# Patient Record
Sex: Female | Born: 1970 | Race: White | Hispanic: No | Marital: Single | Smoking: Former smoker
Health system: Southern US, Community
[De-identification: ages and names within clinical notes are randomized; demographics above are authoritative.]

## PROBLEM LIST (undated history)

## (undated) DIAGNOSIS — G2581 Restless legs syndrome: Secondary | ICD-10-CM

## (undated) DIAGNOSIS — I471 Supraventricular tachycardia, unspecified: Secondary | ICD-10-CM

## (undated) HISTORY — PX: BREAST SURGERY: SHX581

## (undated) HISTORY — PX: TUBAL LIGATION: SHX77

---

## 2008-12-25 ENCOUNTER — Emergency Department (HOSPITAL_BASED_OUTPATIENT_CLINIC_OR_DEPARTMENT_OTHER): Admission: EM | Admit: 2008-12-25 | Discharge: 2008-12-25 | Payer: Self-pay | Admitting: Emergency Medicine

## 2009-10-10 ENCOUNTER — Emergency Department (HOSPITAL_BASED_OUTPATIENT_CLINIC_OR_DEPARTMENT_OTHER): Admission: EM | Admit: 2009-10-10 | Discharge: 2009-10-11 | Payer: Self-pay | Admitting: Emergency Medicine

## 2009-12-27 ENCOUNTER — Emergency Department (HOSPITAL_BASED_OUTPATIENT_CLINIC_OR_DEPARTMENT_OTHER): Admission: EM | Admit: 2009-12-27 | Discharge: 2009-12-27 | Payer: Self-pay | Admitting: Emergency Medicine

## 2010-06-04 ENCOUNTER — Emergency Department (HOSPITAL_BASED_OUTPATIENT_CLINIC_OR_DEPARTMENT_OTHER): Admission: EM | Admit: 2010-06-04 | Discharge: 2010-06-04 | Payer: Self-pay | Admitting: Emergency Medicine

## 2010-06-04 ENCOUNTER — Ambulatory Visit: Payer: Self-pay | Admitting: Diagnostic Radiology

## 2010-09-26 LAB — CBC
HCT: 32.6 % — ABNORMAL LOW (ref 36.0–46.0)
Hemoglobin: 11.3 g/dL — ABNORMAL LOW (ref 12.0–15.0)
MCHC: 34.6 g/dL (ref 30.0–36.0)
MCV: 84.3 fL (ref 78.0–100.0)
Platelets: 301 10*3/uL (ref 150–400)
RDW: 12.2 % (ref 11.5–15.5)

## 2010-09-26 LAB — DIFFERENTIAL
Basophils Absolute: 0.1 10*3/uL (ref 0.0–0.1)
Basophils Relative: 1 % (ref 0–1)
Eosinophils Absolute: 0.5 10*3/uL (ref 0.0–0.7)
Eosinophils Relative: 7 % — ABNORMAL HIGH (ref 0–5)
Lymphocytes Relative: 35 % (ref 12–46)
Monocytes Absolute: 0.6 10*3/uL (ref 0.1–1.0)

## 2011-02-26 ENCOUNTER — Emergency Department (HOSPITAL_BASED_OUTPATIENT_CLINIC_OR_DEPARTMENT_OTHER)
Admission: EM | Admit: 2011-02-26 | Discharge: 2011-02-26 | Disposition: A | Discharge status: Home / Self Care | Payer: Self-pay | Attending: Emergency Medicine | Admitting: Emergency Medicine

## 2011-02-26 ENCOUNTER — Encounter: Payer: Self-pay | Admitting: Student

## 2011-02-26 ENCOUNTER — Emergency Department (INDEPENDENT_AMBULATORY_CARE_PROVIDER_SITE_OTHER): Payer: Self-pay

## 2011-02-26 DIAGNOSIS — M79609 Pain in unspecified limb: Secondary | ICD-10-CM | POA: Insufficient documentation

## 2011-02-26 DIAGNOSIS — M7989 Other specified soft tissue disorders: Secondary | ICD-10-CM

## 2011-02-26 DIAGNOSIS — J45909 Unspecified asthma, uncomplicated: Secondary | ICD-10-CM | POA: Insufficient documentation

## 2011-02-26 DIAGNOSIS — M79606 Pain in leg, unspecified: Secondary | ICD-10-CM

## 2011-02-26 HISTORY — DX: Restless legs syndrome: G25.81

## 2011-02-26 MED ORDER — OXYCODONE-ACETAMINOPHEN 5-325 MG PO TABS: 1.0000 | ORAL_TABLET | ORAL | Status: AC | PRN

## 2011-02-26 MED ORDER — HYDROCODONE-ACETAMINOPHEN 5-325 MG PO TABS
1.0000 | ORAL_TABLET | Freq: Once | ORAL | Status: AC
  Administered 2011-02-26: 1 via ORAL
  Filled 2011-02-26: qty 1

## 2011-02-26 NOTE — ED Provider Notes (Signed)
History     CSN: [id number] Arrival date & time: 02/26/2011  4:18 PM  Chief Complaint  Patient presents with  . Leg Pain   HPI Comments: Patient says she had onset of pain in the left calf last night she rated the pain at an 8 she noted that her left calf with swelling. The pain continued today and she took a [employer], without relief. After work she came to [facility] ED for evaluation.  There is no history of injury.  She has not been on any long trips. She is not on birth control pills.   Patient is a 40 y.o. female presenting with leg pain. The history is provided by the patient. No language interpreter was used.  Leg Pain  The incident occurred 12 to 24 hours ago. The incident occurred at home. There was no injury mechanism. Pain location: Her pain is localized to the left calf. The quality of the pain is described as aching. The pain is at a severity of 8/10. The pain is moderate. The pain has been constant since onset. The symptoms are aggravated by activity. Treatments tried: She took a BC powder without relief.    Past Medical History  Diagnosis Date  . Asthma   . Restless leg syndrome     Past Surgical History  Procedure Date  . Tubal ligation   . Cesarean section     No family history on file.  History  Substance Use Topics  . Smoking status: Never Smoker   . Smokeless tobacco: Never Used  . Alcohol Use: Yes    OB History    Grav Para Term Preterm Abortions TAB SAB Ect Mult Living                  Review of Systems  Constitutional: Negative for fever.  HENT: Negative.   Eyes: Negative.   Respiratory: Negative.   Cardiovascular: Negative.   Genitourinary: Negative.   Musculoskeletal:       Left calf tenderness and pain.  Neurological: Negative.   Psychiatric/Behavioral: Negative.     Physical Exam  BP 120/66  Pulse 80  Temp(Src) 98.2 F (36.8 C) (Oral)  Resp 20  Wt 160 lb (72.576 kg)  SpO2 100%  LMP 02/06/2011  Physical Exam    Nursing note and vitals reviewed. Constitutional: She appears well-developed and well-nourished. Distressed: and mild to moderate distress with left calf pain.  HENT:  Head: Normocephalic and atraumatic.  Right Ear: External ear normal.  Left Ear: External ear normal.  Mouth/Throat: No oropharyngeal exudate.  Eyes: EOM are normal. Pupils are equal, round, and reactive to light. No scleral icterus.  Neck: Normal range of motion. Neck supple.  Cardiovascular: Normal rate, regular rhythm and normal heart sounds.   Pulmonary/Chest: Effort normal and breath sounds normal.  Abdominal: Soft. Bowel sounds are normal. She exhibits no distension. There is no tenderness.  Musculoskeletal:       She has left calf tenderness and some mild swelling. There is no Homans' sign. There is no rash, or other skin lesion. She has intact pulses sensation and function in the left foot.  Lymphadenopathy:    She has no cervical adenopathy.  Neurological: She is alert.       No sensory or motor deficits.  Skin: Skin is warm and dry. No rash noted. No erythema.  Psychiatric: She has a normal mood and affect. Her behavior is normal.    ED Course  Procedures  MDM  Course in the ED, the patient was seen and had physical exam. Hydrocodone-acetaminophen was ordered for her pain. Venous ultrasound of the left calf was ordered.  US Venous Img Lower Unilateral Left Status:  Final result                     Study Result     *RADIOLOGY REPORT*  Clinical Data: Left leg pain and swelling.  LEFT LOWER EXTREMITY VENOUS DUPLEX ULTRASOUND  Technique: Gray-scale sonography with graded compression, as well  as color Doppler and duplex ultrasound were performed to evaluate  the deep venous system of the lower extremity from the level of the  common femoral vein through the popliteal and proximal calf veins.  Spectral Doppler was utilized to evaluate flow at rest and with  distal augmentation maneuvers.   Comparison: None.  Findings: Normal compressibility of the common femoral,  superficial femoral, and popliteal veins is demonstrated, as well  as the visualized proximal calf veins. No filling defects to  suggest DVT on grayscale or color Doppler imaging. Doppler  waveforms show normal direction of venous flow, normal respiratory  phasicity and response to augmentation.  IMPRESSION:  No evidence of left lower extremity deep vein thrombosis.  Original Report Authenticated By: Richarda Overlie, M.D.   8:22 PM Venous ultrasound was negative.  Reassured and released.  Recheck in 5-7 days if pain and swelling persists.  Rx for Percocet q4h prn pain.    Carleene Cooper III, MD 02/26/11 [phone]

## 2011-02-26 NOTE — ED Notes (Signed)
Pt in with c/o left calf pain last night with sudden onset, reports swelling to left calf. HX of restless leg. No relief with elevation or rest

## 2012-03-04 IMAGING — CR DG CHEST 2V
2 series · 2 of 2 positions shown · non-contrast
Comparison: None.

CLINICAL DATA: Shortness of breath and asthma.  Chest tightness.

CHEST - 2 VIEW

[w chest pa]
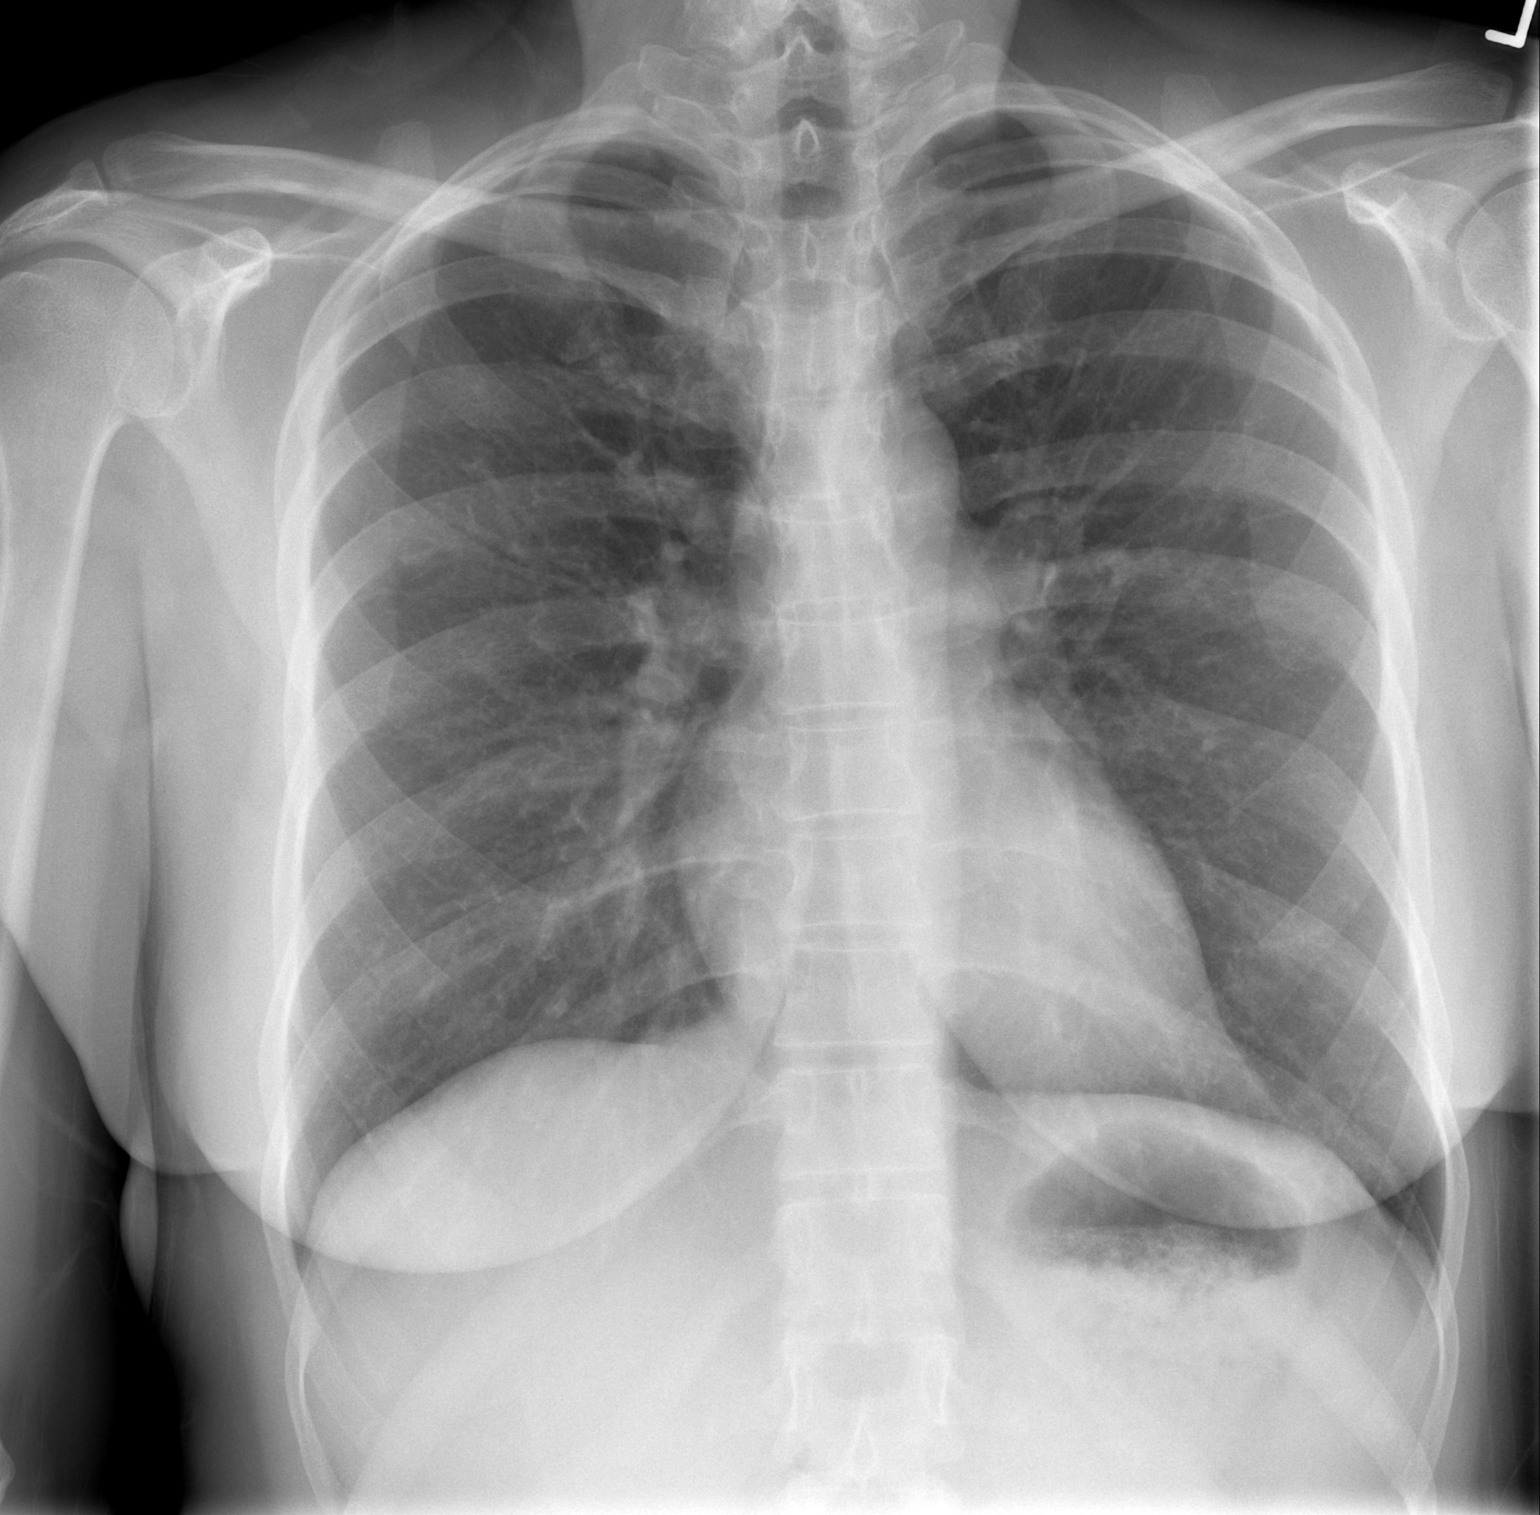

[w chest lat]
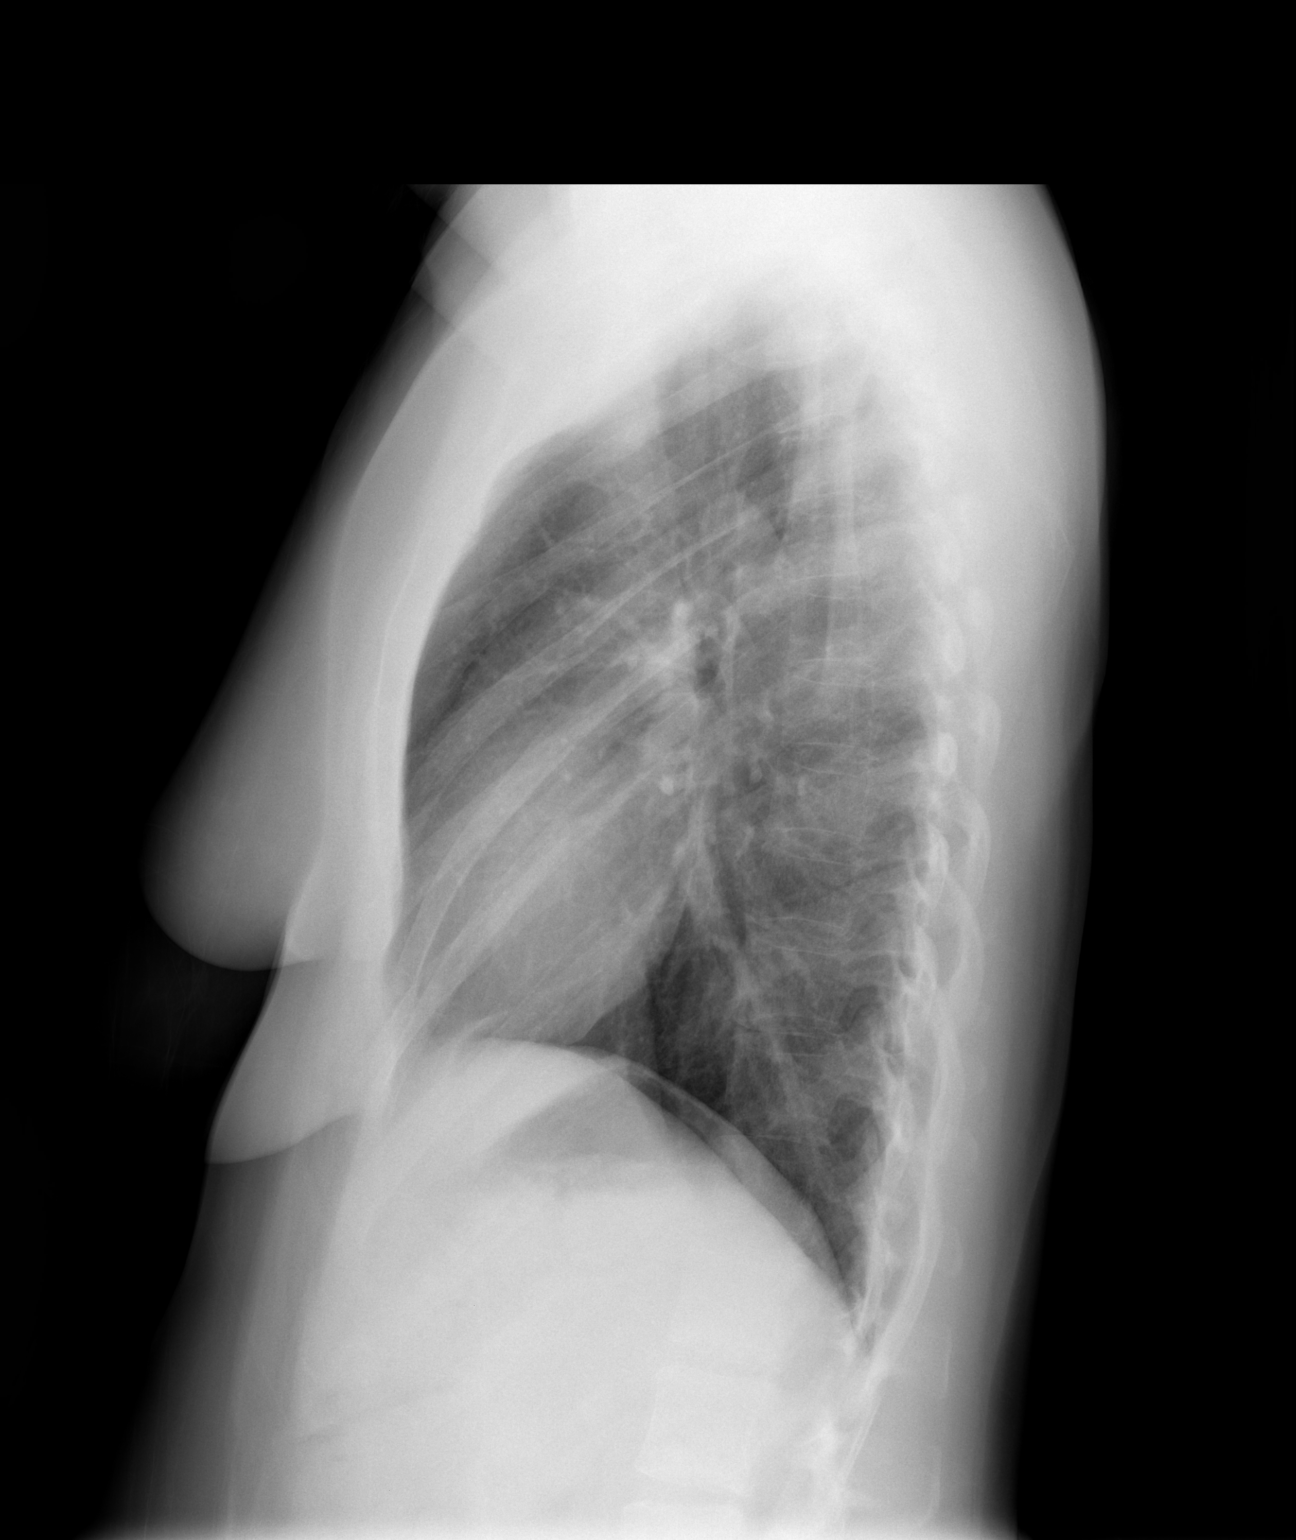

[2 of 2 positions shown; findings below may reference images not displayed]

FINDINGS: The lungs are mildly hyperexpanded with left base
atelectasis seen.  No confluent airspace opacities, pleural
effusion or pneumothorax is present.  The heart is normal in size
and contour.  The upper abdomen and osseous structures are normal.
IMPRESSION: Mild hyperexpansion and left base atelectasis.  No acute findings.

## 2016-01-17 ENCOUNTER — Emergency Department (INDEPENDENT_AMBULATORY_CARE_PROVIDER_SITE_OTHER): Payer: Self-pay

## 2016-01-17 ENCOUNTER — Emergency Department
Admission: EM | Admit: 2016-01-17 | Discharge: 2016-01-17 | Disposition: A | Discharge status: Home / Self Care | Payer: Self-pay | Source: Home / Self Care

## 2016-01-17 ENCOUNTER — Encounter: Payer: Self-pay | Admitting: *Deleted

## 2016-01-17 DIAGNOSIS — J209 Acute bronchitis, unspecified: Secondary | ICD-10-CM

## 2016-01-17 DIAGNOSIS — R0989 Other specified symptoms and signs involving the circulatory and respiratory systems: Secondary | ICD-10-CM

## 2016-01-17 DIAGNOSIS — R05 Cough: Secondary | ICD-10-CM

## 2016-01-17 HISTORY — DX: Supraventricular tachycardia, unspecified: I47.10

## 2016-01-17 HISTORY — DX: Supraventricular tachycardia: I47.1

## 2016-01-17 MED ORDER — AZITHROMYCIN 250 MG PO TABS: ORAL_TABLET | ORAL | Status: AC

## 2016-01-17 MED ORDER — PREDNISONE 20 MG PO TABS: ORAL_TABLET | ORAL | Status: AC

## 2016-01-17 NOTE — Discharge Instructions (Signed)
Continue plain guaifenesin (1200mg  extended release tabs such as Mucinex) twice daily, with plenty of water, for cough and congestion.  May add Pseudoephedrine (30mg , one or two every 4 to 6 hours) for sinus congestion.  Get adequate rest.   May use Afrin nasal spray (or generic oxymetazoline) twice daily for about 5 days and then discontinue.  Also recommend using saline nasal spray several times daily and saline nasal irrigation (AYR is a common brand).  Use Flonase nasal spray each morning after using Afrin nasal spray and saline nasal irrigation. Try warm salt water gargles for sore throat.  Stop all antihistamines for now, and other non-prescription cough/cold preparations. May take Delsym Cough Suppressant at bedtime for nighttime cough.    Follow-up with family doctor if not improving about one week.

## 2016-01-17 NOTE — ED Notes (Signed)
Pt c/o mostly nonproductive cough and runny nose x 3 mths. She reports episodes of SOB at times with coughing. She also c/o swollen lymph nodes in neck x 2 days.

## 2016-01-17 NOTE — ED Provider Notes (Signed)
CSN: [id number]     Arrival date & time 01/17/16  0911 History   None    Chief Complaint  Patient presents with  . Cough      HPI Comments: Patient reports that she developed a cold-like illness about 3 months ago, and has had persistent sinus congestion and non-productive cough.  During the past two days she has had soreness in her cervical lymph nodes.  She has occasional shortness of breath with activity.  She occasionally coughs until she gags.  No fevers, chills, and sweats.  No pleuritic pain.  She has a history of asthma, and a family history of asthma.  The history is provided by the patient.    Past Medical History  Diagnosis Date  . Asthma   . Restless leg syndrome   . SVT (supraventricular tachycardia) [facility](HCC)    Past Surgical History  Procedure Laterality Date  . Tubal ligation    . Cesarean section    . Breast surgery     History reviewed. No pertinent family history. Social History  Substance Use Topics  . Smoking status: Former [employer] developermoker  . Smokeless tobacco: Never Used     Comment: quit at age 45yo  . Alcohol Use: Yes     Comment: 4-5 q wk   OB History    No data available     Review of Systems No sore throat + cough No pleuritic pain No wheezing + nasal congestion No post-nasal drainage No sinus pain/pressure No itchy/red eyes No earache No hemoptysis + SOB with cough No fever/chills No nausea No vomiting No abdominal pain No diarrhea No urinary symptoms No skin rash + fatigue No myalgias + headache Used OTC meds without relief  Allergies  Penicillins  Home Medications   Prior to Admission medications   Medication Sig Start Date End Date Taking? Authorizing Provider  dextromethorphan-guaiFENesin (MUCINEX DM) 30-600 MG 12hr tablet Take 1 tablet by mouth 2 (two) times daily.   Yes Historical Provider, MD  GuaiFENesin (MUCINEX PO) Take by mouth.   Yes Historical Provider, MD  azithromycin (ZITHROMAX Z-PAK) 250 MG tablet Take 2 tabs today;  then begin one tab once daily for 4 more days. 01/17/16   Lattie HawStephen A Beese, MD  predniSONE (DELTASONE) 20 MG tablet Take one tab by mouth twice daily for 5 days, then one daily for 3 days. Take with food. 01/17/16   Lattie HawStephen A Beese, MD   Meds Ordered and Administered this Visit  Medications - No data to display  BP 121/77 mmHg  Pulse 71  Temp(Src) 97.9 F (36.6 C) (Oral)  Resp 16  Ht 5\' 2"  (1.575 m)  Wt 165 lb (74.844 kg)  BMI 30.17 kg/m2  SpO2 100%  LMP 01/09/2016 No data found.   Physical Exam Nursing notes and Vital Signs reviewed. Appearance:  Patient appears stated age, and in no acute distress Eyes:  Pupils are equal, round, and reactive to light and accomodation.  Extraocular movement is intact.  Conjunctivae are not inflamed  Ears:  Canals normal.  Tympanic membranes normal.  Nose:  Mildly congested turbinates.  No sinus tenderness.   Pharynx:  Normal Neck:  Supple.  Tender enlarged posterior/lateral nodes are palpated bilaterally  Lungs:  Clear to auscultation.  Breath sounds are equal.  Moving air well. Heart:  Regular rate and rhythm without murmurs, rubs, or gallops.  Abdomen:  Nontender without masses or hepatosplenomegaly.  Bowel sounds are present.  No CVA or flank tenderness.  Extremities:  No  edema.  Skin:  No rash present.   ED Course  Procedures none    Imaging Review Dg Chest 2 View  01/17/2016  CLINICAL DATA:  Cough and congestion EXAM: CHEST  2 VIEW COMPARISON:  06/04/2010 FINDINGS: The heart size and mediastinal contours are within normal limits. Both lungs are clear. The visualized skeletal structures are unremarkable. IMPRESSION: No active cardiopulmonary disease. Electronically Signed   By: Marlan Palau M.D.   On: 01/17/2016 10:54      MDM   1. Acute bronchitis, unspecified organism; ?post-infectious cough vs pertussis   Begin Z-pak for atypical coverage.  Begin prednisone burst/taper Continue plain guaifenesin (  extended release tabs such  as Mucinex) twice daily, with plenty of water, for cough and congestion.  May add Pseudoephedrine ( , one or two every 4 to 6 hours) for sinus congestion.  Get adequate rest.   May use Afrin nasal spray (or generic oxymetazoline) twice daily for about 5 days and then discontinue.  Also recommend using saline nasal spray several times daily and saline nasal irrigation (AYR is a common brand).  Use Flonase nasal spray each morning after using Afrin nasal spray and saline nasal irrigation. Try warm salt water gargles for sore throat.  Stop all antihistamines for now, and other non-prescription cough/cold preparations. May take Delsym Cough Suppressant at bedtime for nighttime cough.    Follow-up with family doctor if not improving about one week.     Lattie Haw, MD 01/20/16 [phone]

## 2017-10-17 IMAGING — DX DG CHEST 2V
2 series · 2 of 2 positions shown · non-contrast
Comparison: 06/04/2010

CLINICAL DATA: Cough and congestion

EXAM:
CHEST  2 VIEW

[chest pa]
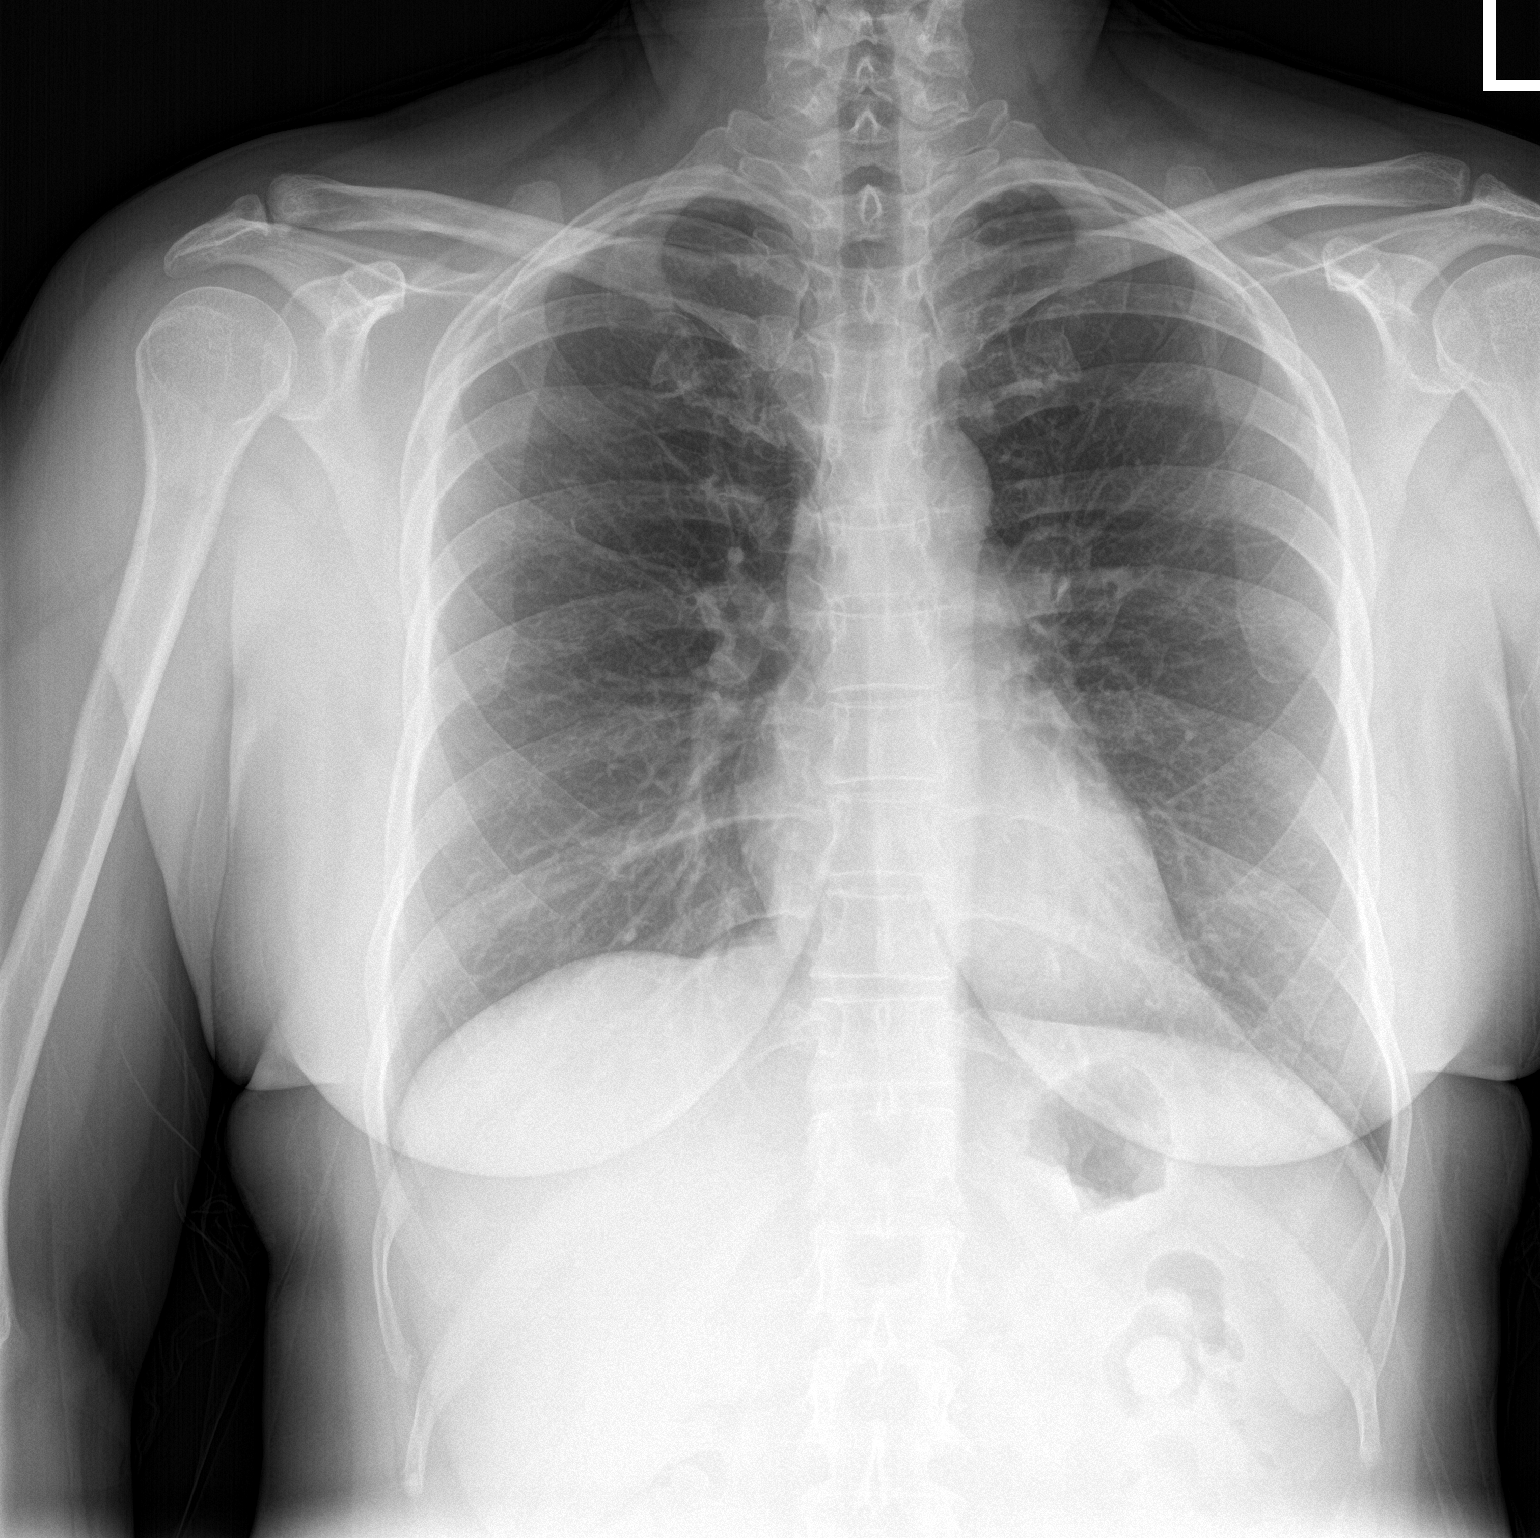

[chest lat]
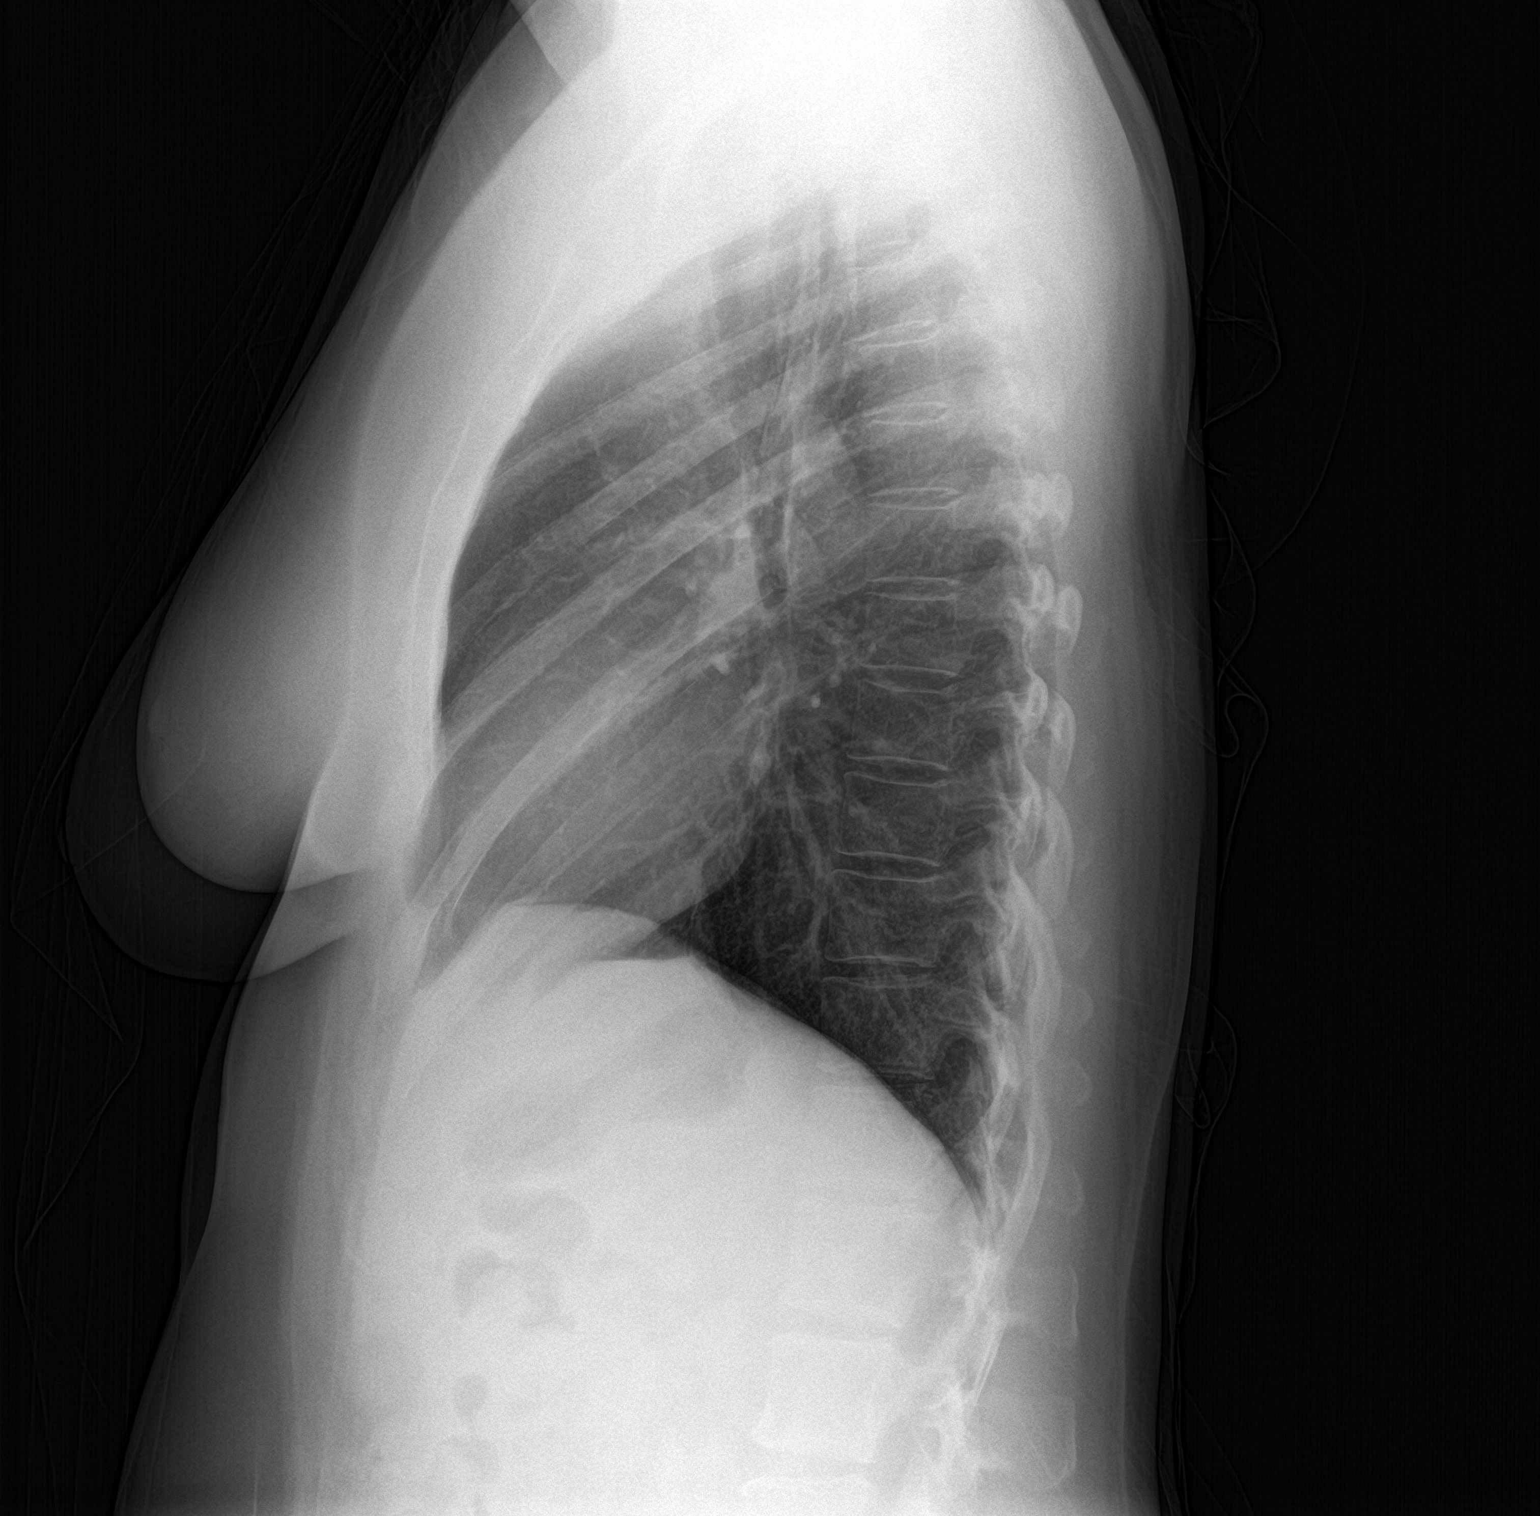

[2 of 2 positions shown; findings below may reference images not displayed]

FINDINGS: The heart size and mediastinal contours are within normal limits.
Both lungs are clear. The visualized skeletal structures are
unremarkable.
IMPRESSION: No active cardiopulmonary disease.
